# Patient Record
Sex: Female | Born: 2000 | Race: White | Hispanic: No | Marital: Single | Smoking: Current every day smoker
Health system: Southern US, Community
[De-identification: ages and names within clinical notes are randomized; demographics above are authoritative.]

## PROBLEM LIST (undated history)

## (undated) DIAGNOSIS — F419 Anxiety disorder, unspecified: Secondary | ICD-10-CM

## (undated) DIAGNOSIS — F329 Major depressive disorder, single episode, unspecified: Secondary | ICD-10-CM

## (undated) DIAGNOSIS — R45851 Suicidal ideations: Secondary | ICD-10-CM

## (undated) DIAGNOSIS — M359 Systemic involvement of connective tissue, unspecified: Secondary | ICD-10-CM

## (undated) DIAGNOSIS — F449 Dissociative and conversion disorder, unspecified: Secondary | ICD-10-CM

## (undated) DIAGNOSIS — K589 Irritable bowel syndrome without diarrhea: Secondary | ICD-10-CM

## (undated) DIAGNOSIS — K200 Eosinophilic esophagitis: Secondary | ICD-10-CM

## (undated) DIAGNOSIS — F32A Depression, unspecified: Secondary | ICD-10-CM

## (undated) DIAGNOSIS — F5105 Insomnia due to other mental disorder: Secondary | ICD-10-CM

## (undated) DIAGNOSIS — F938 Other childhood emotional disorders: Secondary | ICD-10-CM

## (undated) HISTORY — PX: COLONOSCOPY: SHX174

## (undated) HISTORY — PX: UPPER GI ENDOSCOPY: SHX6162

---
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
## 2022-03-04 ENCOUNTER — Inpatient Hospital Stay: Admission: RE | Admit: 2022-03-04 | Payer: Self-pay | Source: Ambulatory Visit

## 2022-04-13 IMAGING — DX DG ANKLE COMPLETE 3+V*R*
3 series · 3 of 3 positions shown · non-contrast
Comparison: None.

CLINICAL DATA: Foot and ankle pain

EXAM:
RIGHT ANKLE - COMPLETE 3+ VIEW

[ankle obl]
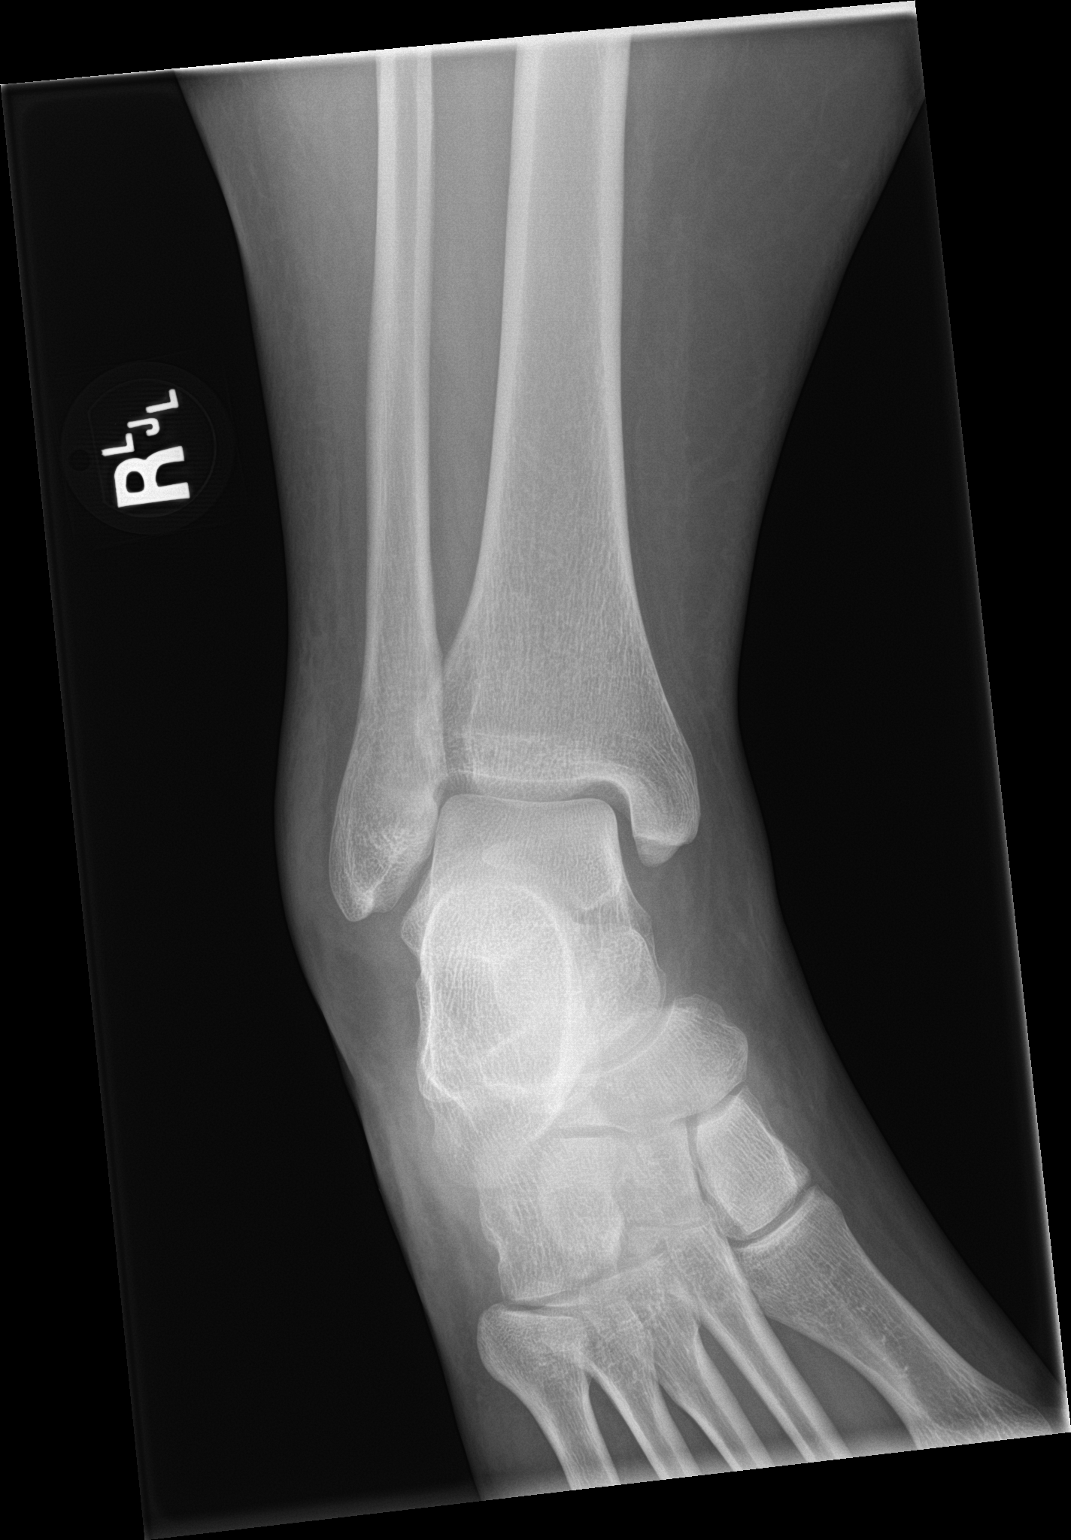

[ankle ap]
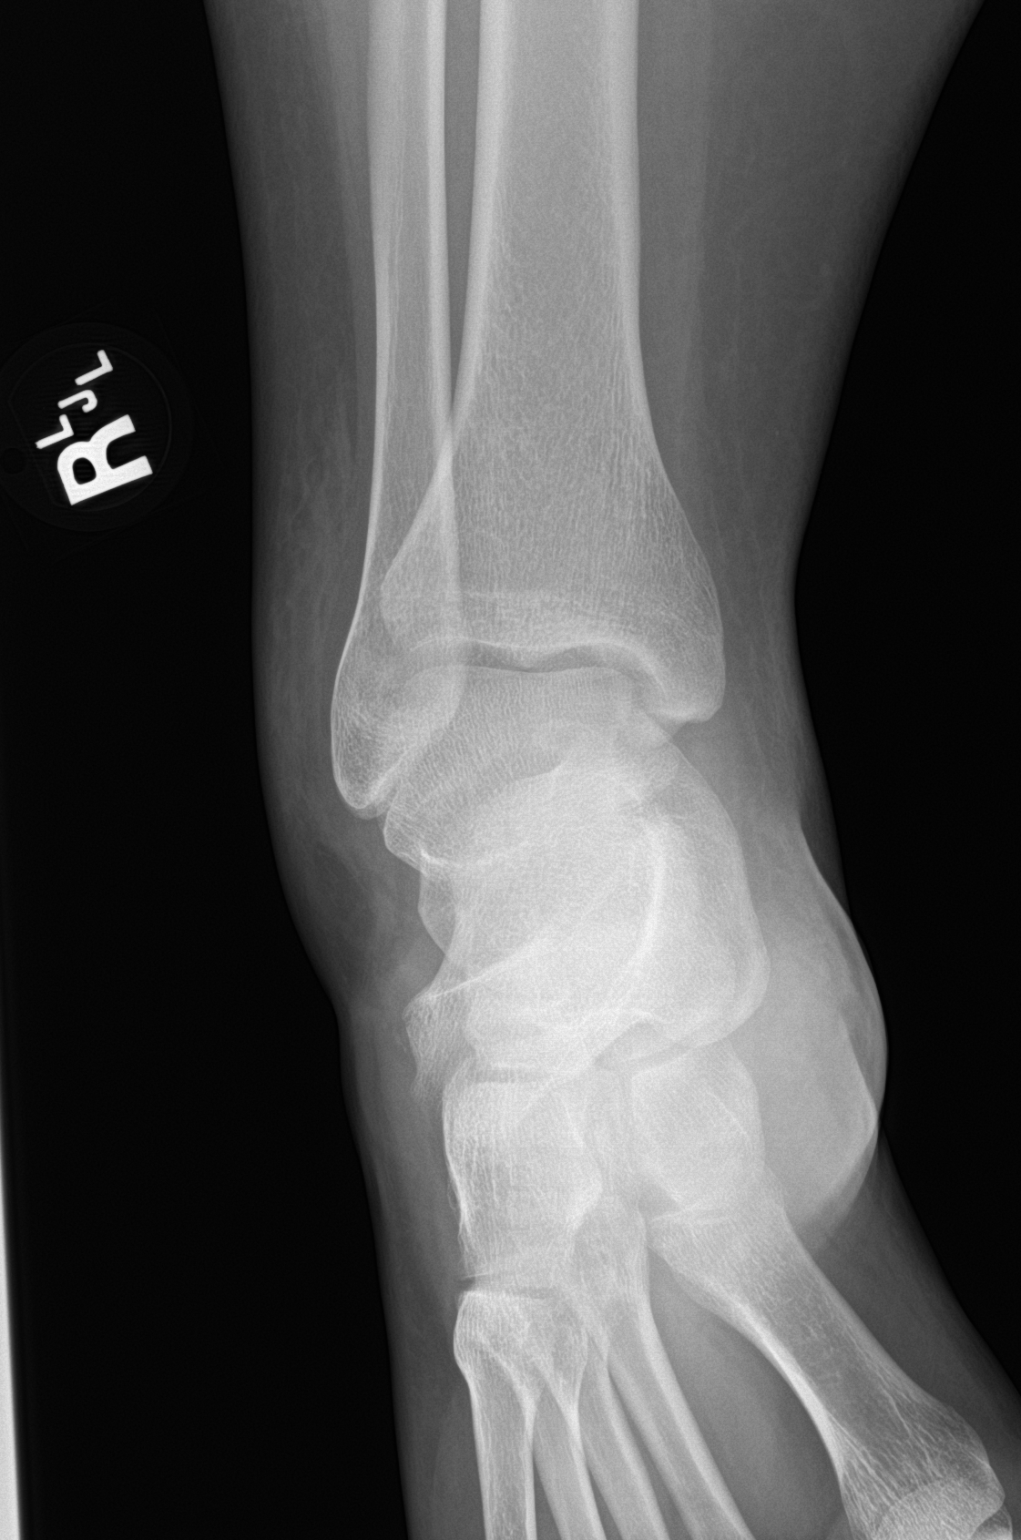

[ankle lat]
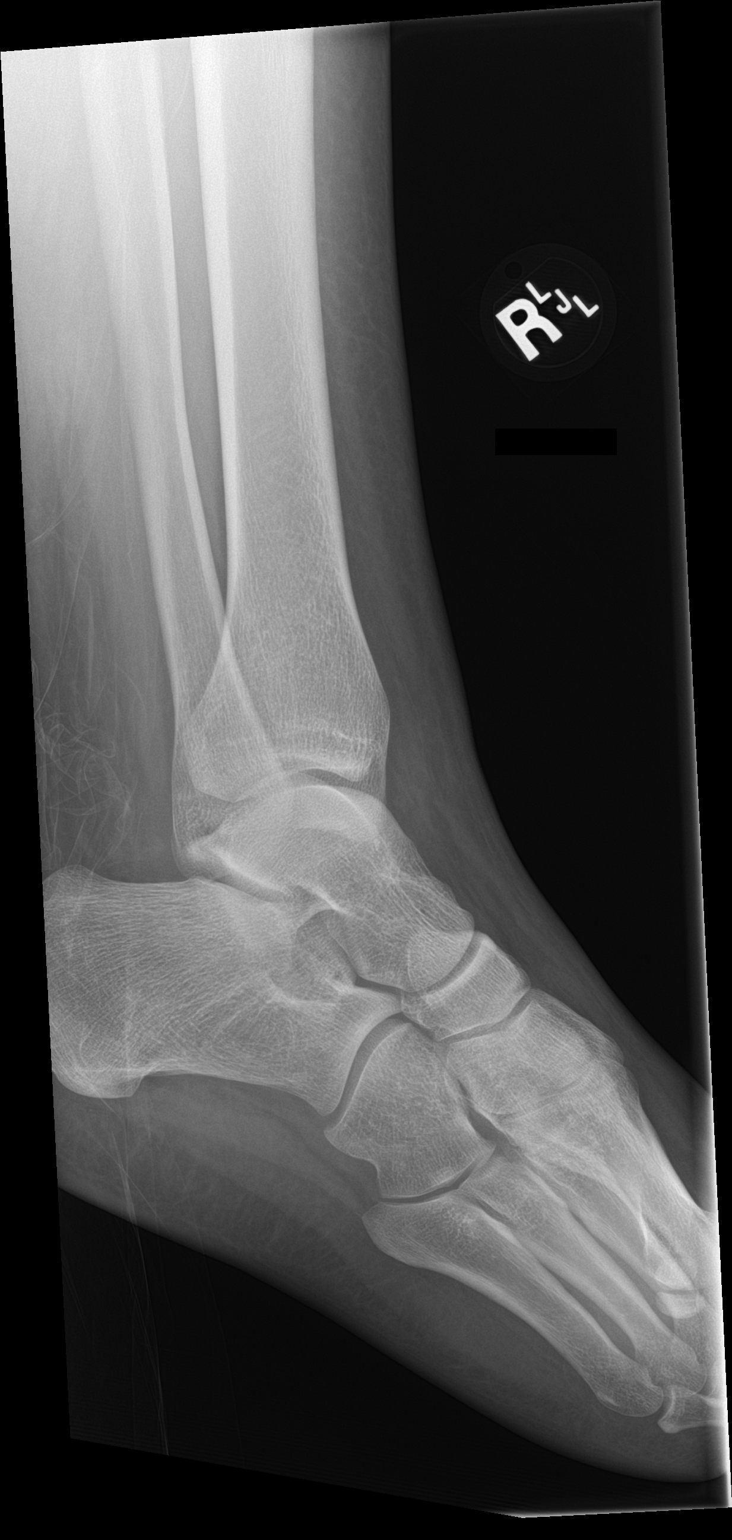

[3 of 3 positions shown; findings below may reference images not displayed]

FINDINGS: Diffuse soft tissue swelling. No acute bony abnormality.
Specifically, no fracture, subluxation, or dislocation.
IMPRESSION: No acute bony abnormality.

## 2022-04-13 IMAGING — DX DG FOOT COMPLETE 3+V*R*
3 series · 3 of 3 positions shown · non-contrast
Comparison: None.

CLINICAL DATA: Right foot pain.

EXAM:
RIGHT FOOT COMPLETE - 3+ VIEW

[foot ap]
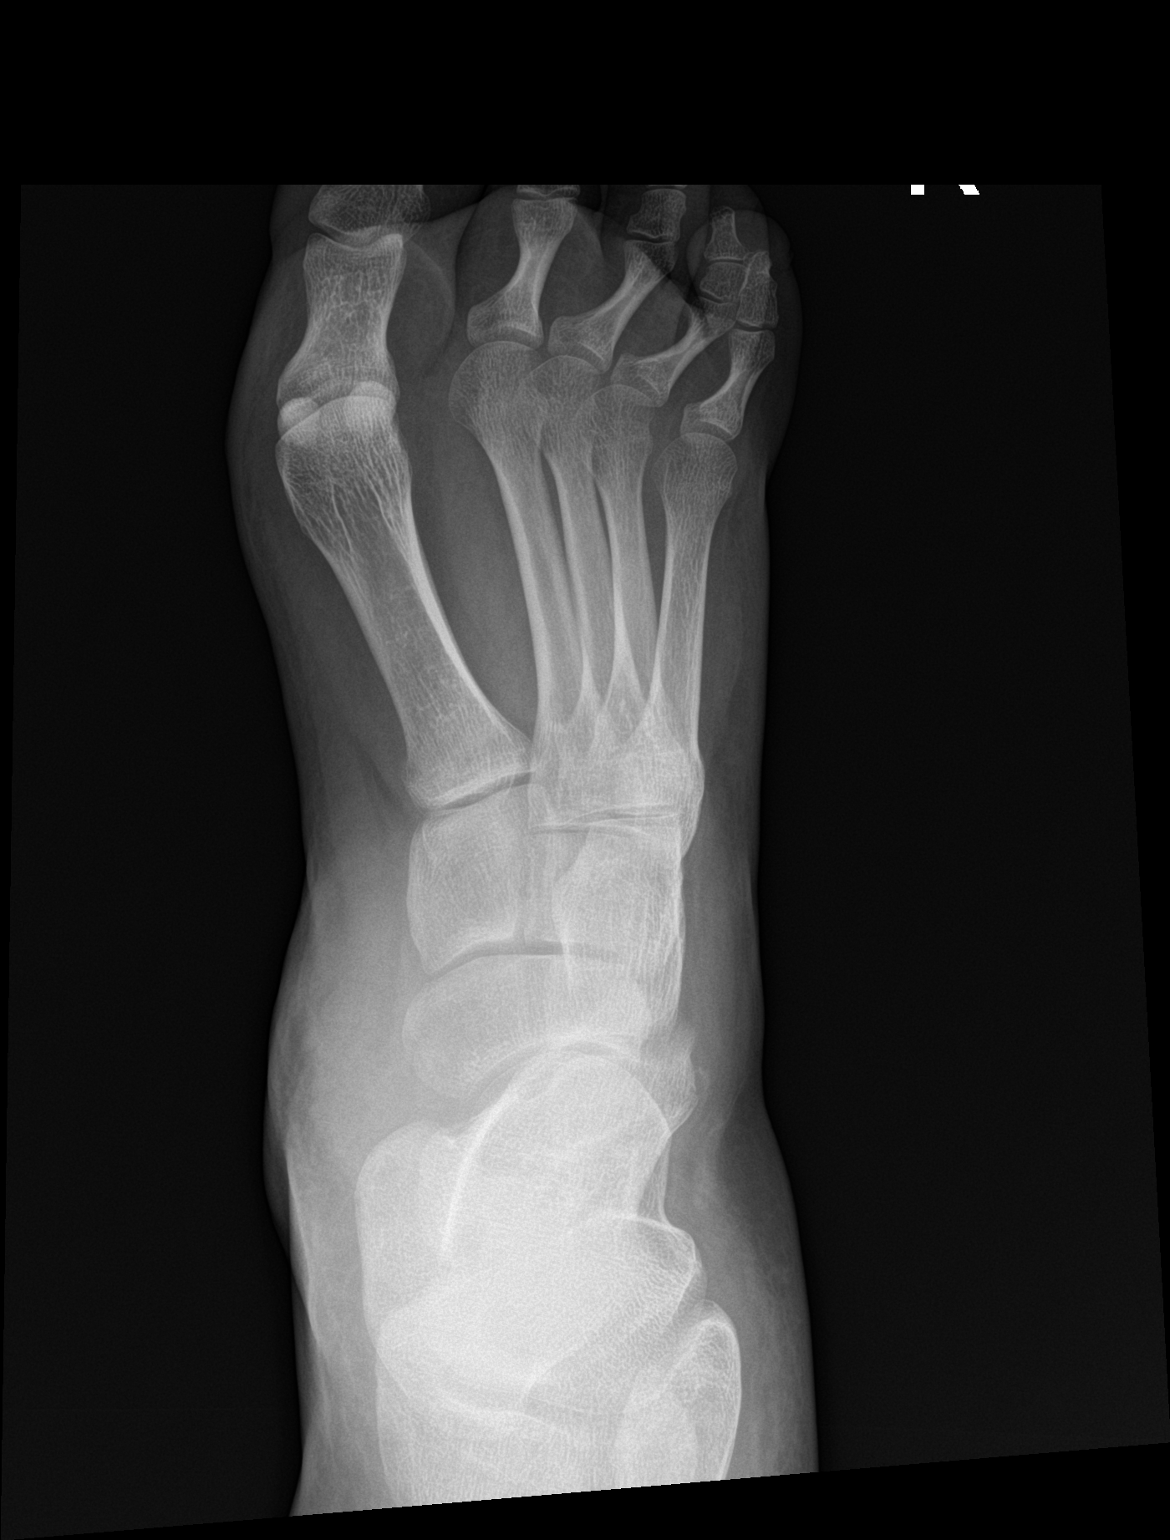

[foot obl]
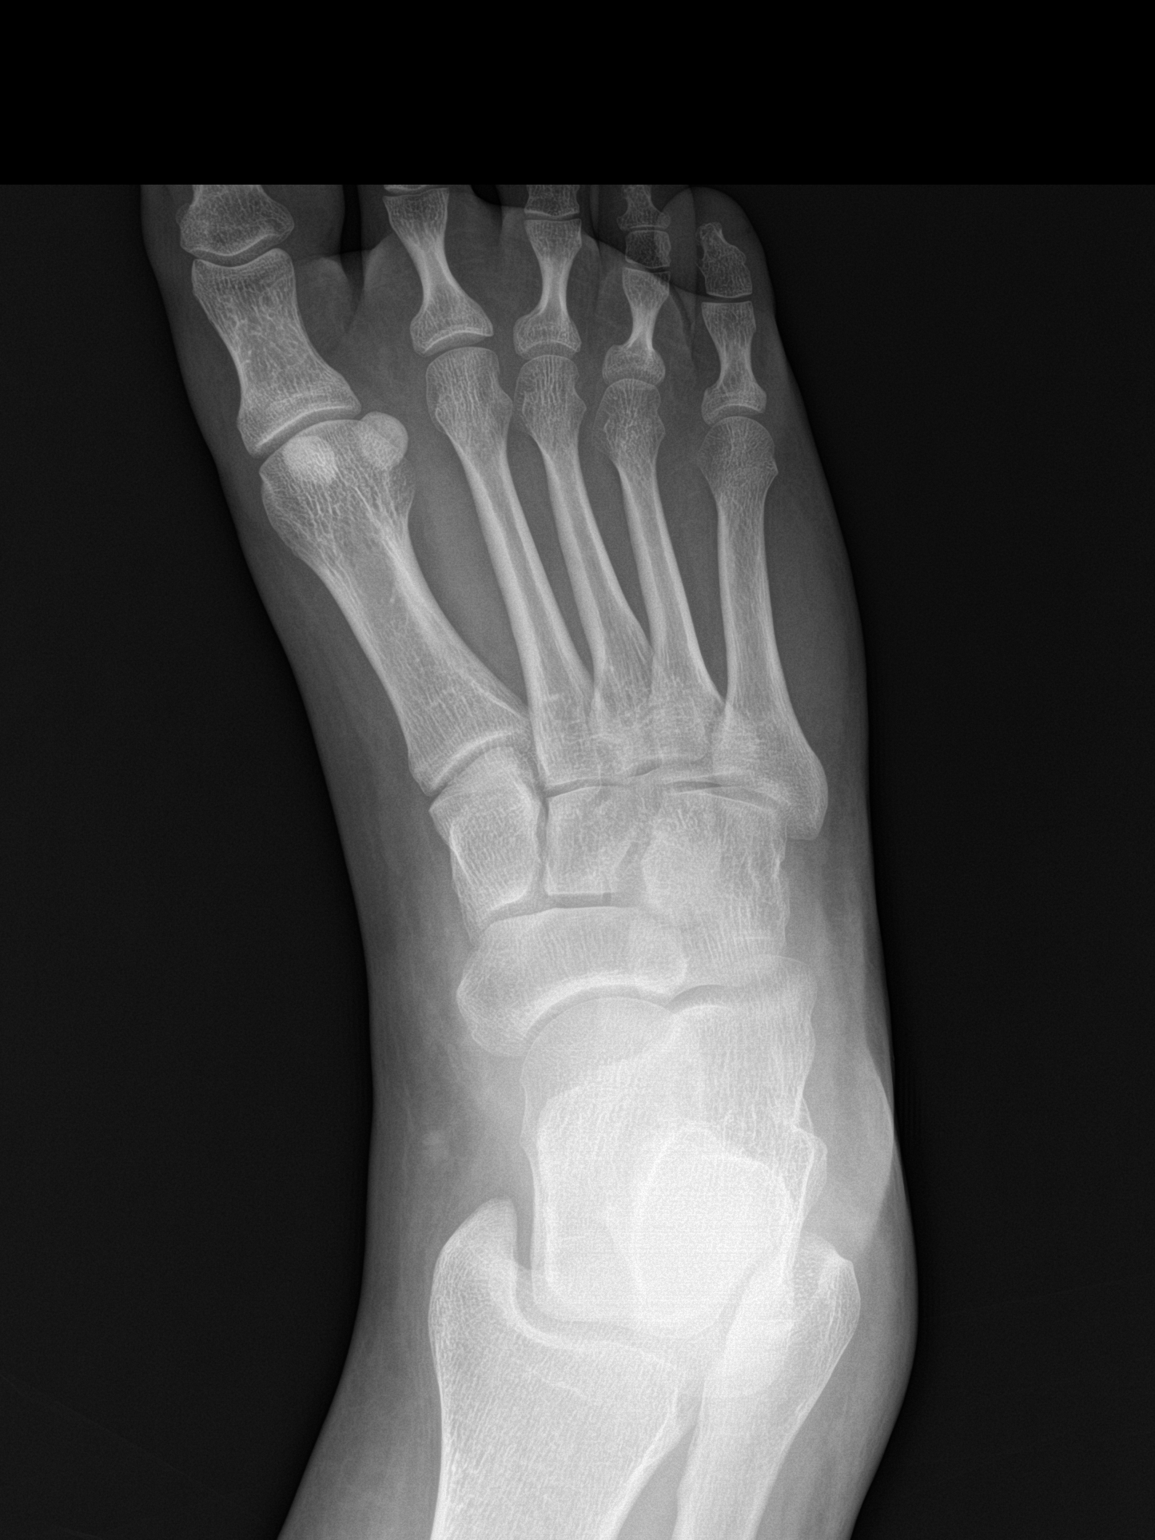

[foot lat]
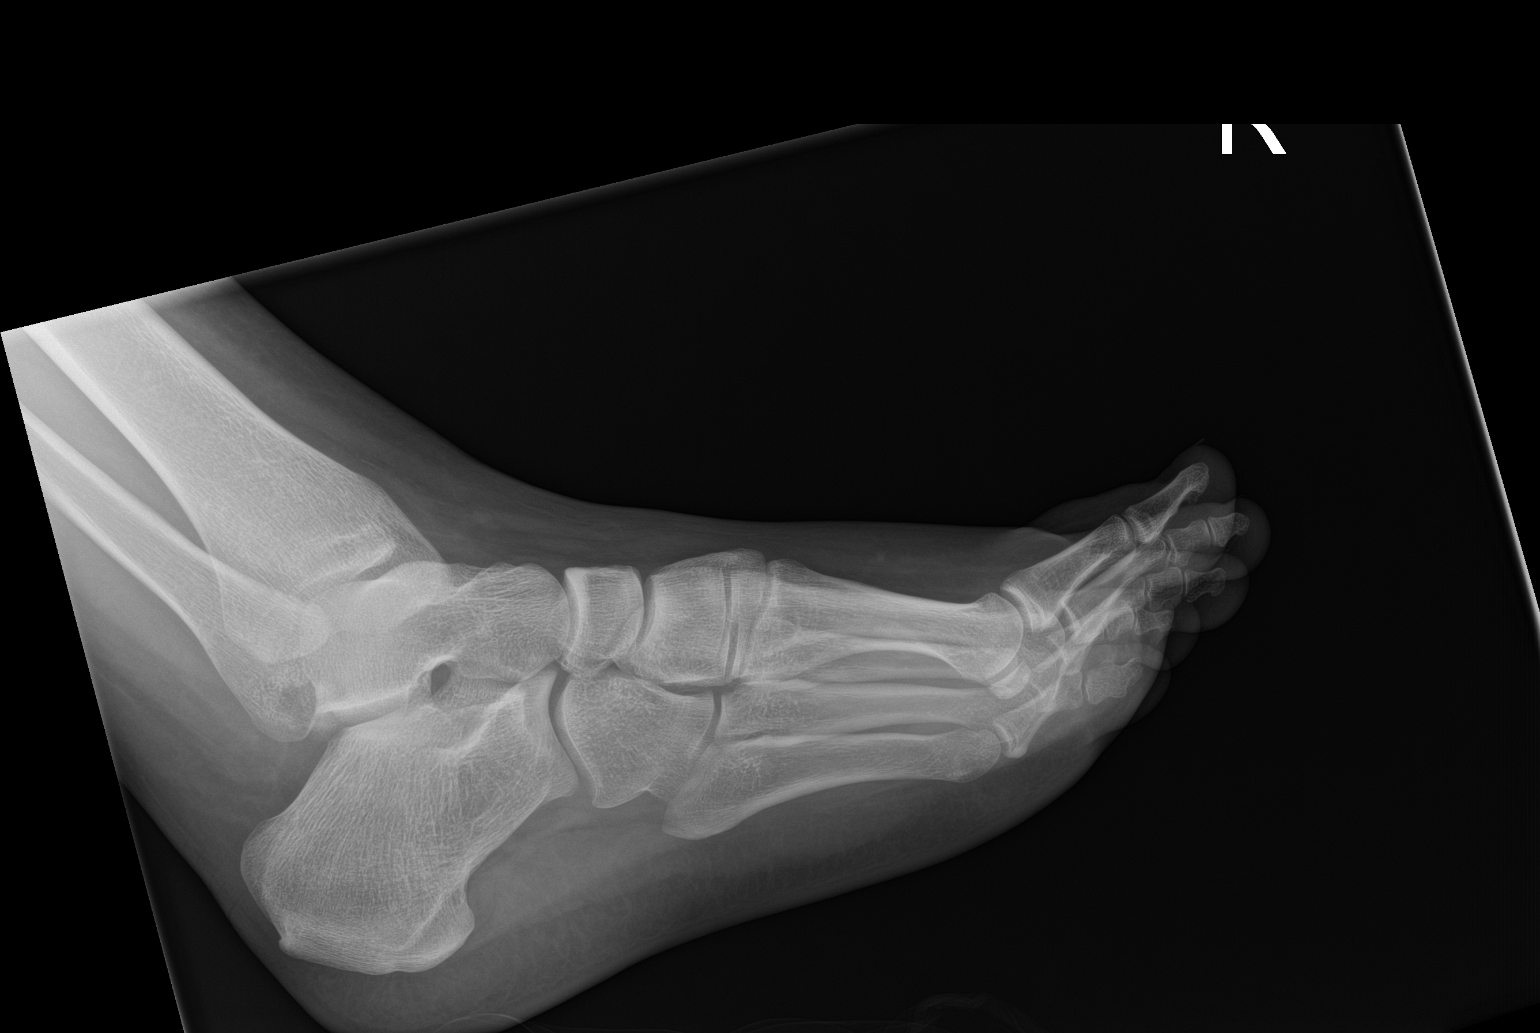

[3 of 3 positions shown; findings below may reference images not displayed]

FINDINGS: There is no evidence of fracture or dislocation. There is no
evidence of arthropathy or other focal bone abnormality. Soft
tissues are unremarkable.
IMPRESSION: Negative.

## 2022-04-16 ENCOUNTER — Encounter: Payer: Self-pay | Admitting: Internal Medicine

## 2022-07-12 ENCOUNTER — Ambulatory Visit
Admission: RE | Admit: 2022-07-12 | Discharge: 2022-07-12 | Disposition: A | Discharge status: Home / Self Care | Payer: Medicaid Other | Source: Ambulatory Visit | Attending: Family Medicine | Admitting: Family Medicine

## 2022-07-12 VITALS — BP 103/53 | HR 100 | Temp 97.7°F | Resp 18

## 2022-07-12 DIAGNOSIS — J069 Acute upper respiratory infection, unspecified: Secondary | ICD-10-CM

## 2022-07-12 NOTE — ED Triage Notes (Signed)
Patient presents to UC for cough and nasal congestion x 3 days. Treating with sudafed.

## 2022-07-12 NOTE — ED Provider Notes (Signed)
Renaldo Fiddler    CSN: [id number] Arrival date & time: 07/12/22  1559      History   Chief Complaint Chief Complaint  Patient presents with   Nasal Congestion    I've had nasal and chest congestion, cough, and just feel crappy. - Entered by patient    HPI Kathryn [name] is a 22 y.o. female.   Patient presents for evaluation of nasal congestion, rhinorrhea and a nonproductive cough present for 3 days.  Endorses general malaise, worsening at nighttime.  Has begun to spill some soreness to the tonsils, endorses tonsillar adenopathy at baseline and has been told that she needs removal.  Has attempted use of pseudoephedrine, NyQuil which has been helpful.  Denies fevers, shortness of breath or wheeze.  Denies respiratory history.  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   Vasovagal syncope 03/20/2017   Migraine without aura and without status migrainosus, not intractable 03/20/2017   Episodic tension-type headache, not intractable 03/20/2017   Transient alteration of awareness 03/20/2017   Seizure-like activity (HCC) 03/18/2017   Difficulty walking 03/18/2017   MDD (major depressive disorder) 01/10/2017   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      Home Medications    Prior to Admission medications   Medication Sig Start Date End Date Taking? Authorizing Provider  acetaminophen (TYLENOL) 500 MG tablet Take 1,000 mg by mouth every 6 (six) hours as needed for headache.    [provider]  clindamycin (CLEOCIN T) 1 % external solution Apply topically 2 (two) times daily. 02/02/21   [provider]  FLUoxetine (PROZAC) 20 MG capsule Take 20 mg by mouth daily.    [provider]  lisdexamfetamine (VYVANSE) 60 MG capsule Take 60 mg by mouth every morning.    [provider]  loratadine (CLARITIN) 10 MG tablet Take 10 mg by mouth daily. 03/04/16   [provider]  metoprolol tartrate (LOPRESSOR) 25 MG tablet Take 0.5 tablets (12.5 mg total) by mouth 2 (two) times daily. 07/25/21   Maisie Fus, MD  Multiple Vitamin (MULTIVITAMIN WITH MINERALS) TABS tablet Take 1 tablet by mouth daily.    [provider]  QUEtiapine (SEROQUEL) 50 MG tablet TAKE ONE TABLET AT BEDTIME. YOU MAY TAKE ANOTHER TABLET IN ONE HOUR IF NEEDED. 12/27/17   [provider]    Family History History reviewed. No pertinent family history.  Social History Social History   Tobacco Use   Smoking status: Every Day    Types: E-cigarettes   Smokeless tobacco: Never   Tobacco comments:    vapes  Vaping Use   Vaping Use: Never used  Substance Use Topics   Alcohol use:  No   Drug use: No     Allergies   Flovent hfa [fluticasone], Ibuprofen, Lactose intolerance (gi), Milk-related compounds, Pollen extract, and Sulfamethoxazole-trimethoprim   Review of Systems Review of Systems   Physical Exam Triage Vital Signs ED Triage Vitals  Enc Vitals Group     BP 07/12/22 1704 (!) 103/53     Pulse Rate 07/12/22 1704 100     Resp 07/12/22 1704 18     Temp 07/12/22 1704 97.7 F (36.5 C)     Temp Source 07/12/22 1704 Temporal     SpO2 07/12/22 1704 97 %     Weight --       Height --      Head Circumference --      Peak Flow --      Pain Score 07/12/22 1703 0     Pain Loc --      Pain Edu? --      Excl. in GC? --    No data found.  Updated Vital Signs BP (!) 103/53 (BP Location: Left Arm)   Pulse 100   Temp 97.7 F (36.5 C) (Temporal)   Resp 18   SpO2 97%   Visual Acuity Right Eye Distance:   Left Eye Distance:   Bilateral Distance:    Right Eye Near:   Left Eye Near:    Bilateral Near:     Physical Exam Constitutional:      Appearance: Normal appearance.  HENT:     Right Ear: Tympanic membrane, ear canal and external ear normal.     Left Ear: Tympanic membrane, ear canal and external ear normal.     Nose: Congestion and rhinorrhea present.     Mouth/Throat:     Pharynx: No posterior oropharyngeal erythema.     Tonsils: Tonsillar exudate present. 3+ on the right. 3+ on the left.  Cardiovascular:     Rate and Rhythm: Normal rate and regular rhythm.     Pulses: Normal pulses.     Heart sounds: Normal heart sounds.  Pulmonary:     Effort: Pulmonary effort is normal.     Breath sounds: Normal breath sounds.  Neurological:     Mental Status: She is alert and oriented to person, place, and time. Mental status is at baseline.      UC Treatments / Results  Labs (all labs ordered are listed, but only abnormal results are displayed) Labs Reviewed - No data to display  EKG   Radiology No results found.  Procedures Procedures (including critical care time)  Medications Ordered in UC Medications - No data to display  Initial Impression / Assessment and Plan / UC Course  I have reviewed the triage vital signs and the nursing notes.  Pertinent labs & imaging results that were available during my care of the patient were reviewed by me and considered in my medical decision making (see chart for details).  Viral URI with cough  Patient is in no signs of distress nor toxic appearing.  Vital signs are stable.  Low suspicion for  pneumonia, pneumothorax or bronchitis and therefore will defer imaging. declined Strep test.May use additional over-the-counter medications as needed for supportive care.  May follow-up with urgent care as needed if symptoms persist or worsen.  Note given.   Final Clinical Impressions(s) / UC Diagnoses   Final diagnoses:  Viral URI with cough     Discharge Instructions      Your symptoms today are most likely being caused by  a virus and should steadily improve in time it can take up to 7 to 10 days before you truly start to see a turnaround however things will get better    You can take Tylenol and/or Ibuprofen as needed for fever reduction and pain relief.   For cough: honey 1/2 to 1 teaspoon (you can dilute the honey in water or another fluid).  You can also use guaifenesin and dextromethorphan for cough. You can use a humidifier for chest congestion and cough.  If you don't have a humidifier, you can sit in the bathroom with the hot shower running.      For sore throat: try warm salt water gargles, cepacol lozenges, throat spray, warm tea or water with lemon/honey, popsicles or ice, or OTC cold relief medicine for throat discomfort.   For congestion: take a daily anti-histamine like Zyrtec, Claritin, and a oral decongestant, such as pseudoephedrine.  You can also use Flonase 1-2 sprays in each nostril daily.   It is important to stay hydrated: drink plenty of fluids (water, gatorade/powerade/pedialyte, juices, or teas) to keep your throat moisturized and help further relieve irritation/discomfort.    ED Prescriptions   None    PDMP not reviewed this encounter.   Valinda Hoar, NP 07/12/22 [phone]

## 2022-07-12 NOTE — Discharge Instructions (Signed)

## 2022-08-21 ENCOUNTER — Encounter: Payer: Medicaid Other | Admitting: Family Medicine

## 2022-09-01 ENCOUNTER — Inpatient Hospital Stay: Admission: RE | Admit: 2022-09-01 | Payer: MEDICAID | Source: Ambulatory Visit

## 2022-09-06 ENCOUNTER — Ambulatory Visit
Admission: EM | Admit: 2022-09-06 | Discharge: 2022-09-06 | Disposition: A | Discharge status: Home / Self Care | Payer: MEDICAID | Attending: Family Medicine | Admitting: Family Medicine

## 2022-09-06 ENCOUNTER — Ambulatory Visit (INDEPENDENT_AMBULATORY_CARE_PROVIDER_SITE_OTHER): Payer: MEDICAID

## 2022-09-06 DIAGNOSIS — S4992XA Unspecified injury of left shoulder and upper arm, initial encounter: Secondary | ICD-10-CM | POA: Diagnosis not present

## 2022-09-06 MED ORDER — CYCLOBENZAPRINE HCL 10 MG PO TABS: 10.0000 mg | ORAL_TABLET | Freq: Two times a day (BID) | ORAL | 0 refills | Status: AC | PRN

## 2022-09-06 NOTE — ED Provider Notes (Signed)
Renaldo Fiddler    CSN: [id number] Arrival date & time: 09/06/22  1259      History   Chief Complaint Chief Complaint  Patient presents with   Shoulder Injury    HPI Kathryn [name] is a 22 y.o. female.   HPI Patient here for evaluation of left shoulder injury. She reports 6 days ago her left shoulder "popped out of place"twice and she had to put shoulder back into place. Complains of ongoing shoulder pain. She is also requesting work excuse dating back to the day her injury occurred. She is reporting that she is unable to lift boxes and is suppose to return to work tomorrow.  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   Vasovagal syncope 03/20/2017   Migraine without aura and without status migrainosus, not intractable 03/20/2017   Episodic tension-type headache, not intractable 03/20/2017   Transient alteration of awareness 03/20/2017   Seizure-like activity (HCC) 03/18/2017   Difficulty walking 03/18/2017   MDD (major depressive disorder) 01/10/2017   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      Home Medications    Prior to Admission medications   Medication Sig Start Date End Date Taking? Authorizing Provider   cyclobenzaprine (FLEXERIL) 10 MG tablet Take 1 tablet (10 mg total) by mouth 2 (two) times daily as needed for muscle spasms. 09/06/22  Yes Bing Neighbors, NP  acetaminophen (TYLENOL) 500 MG tablet Take 1,000 mg by mouth every 6 (six) hours as needed for headache.    [provider]  clindamycin (CLEOCIN T) 1 % external solution Apply topically 2 (two) times daily. 02/02/21   [provider]  FLUoxetine (PROZAC) 20 MG capsule Take 20 mg by mouth daily.    [provider]  lisdexamfetamine (VYVANSE) 60 MG capsule Take 60 mg by mouth every morning.    [provider]  loratadine (CLARITIN) 10 MG tablet Take 10 mg by mouth daily. 03/04/16   [provider]  metoprolol tartrate (LOPRESSOR) 25 MG tablet Take 0.5 tablets (12.5 mg total) by mouth 2 (two) times daily. 07/25/21   Maisie Fus, MD  Multiple Vitamin (MULTIVITAMIN WITH MINERALS) TABS tablet Take 1 tablet by mouth daily.    [provider]  QUEtiapine (SEROQUEL) 50 MG tablet TAKE ONE TABLET AT BEDTIME. YOU MAY TAKE ANOTHER TABLET IN ONE HOUR IF NEEDED. 12/27/17   [provider]    Family History History reviewed. No pertinent family history.  Social History Social History   Tobacco Use   Smoking status: Every Day    Types: E-cigarettes   Smokeless tobacco: Never   Tobacco comments:    vapes  Vaping Use   Vaping status:  Never Used  Substance Use Topics   Alcohol use: No   Drug use: No     Allergies   Flovent hfa [fluticasone], Ibuprofen, Lactose intolerance (gi), Milk-related compounds, Pollen extract, and Sulfamethoxazole-trimethoprim   Review of Systems Review of Systems Pertinent negatives listed in HPI   Physical Exam Triage Vital Signs ED Triage Vitals [09/06/22 1331]  Encounter Vitals Group     BP 112/82     Systolic BP Percentile      Diastolic BP Percentile      Pulse Rate (!) 102     Resp 18     Temp 98.2 F (36.8 C)     Temp Source Oral      SpO2 97 %     Weight      Height      Head Circumference      Peak Flow      Pain Score      Pain Loc      Pain Education      Exclude from Growth Chart    No data found.  Updated Vital Signs BP 112/82 (BP Location: Left Arm)   Pulse (!) 102   Temp 98.2 F (36.8 C) (Oral)   Resp 18   SpO2 97%   Visual Acuity Right Eye Distance:   Left Eye Distance:   Bilateral Distance:    Right Eye Near:   Left Eye Near:    Bilateral Near:     Physical Exam Vitals reviewed.  Constitutional:      Appearance: Normal appearance.  Eyes:     Extraocular Movements: Extraocular movements intact.     Conjunctiva/sclera: Conjunctivae normal.     Pupils: Pupils are equal, round, and reactive to light.  Cardiovascular:     Rate and Rhythm: Normal rate and regular rhythm.  Pulmonary:     Effort: Pulmonary effort is normal.     Breath sounds: Normal breath sounds.  Musculoskeletal:     Right shoulder: Normal.     Left shoulder: Normal.     Cervical back: Normal range of motion.  Skin:    General: Skin is warm and dry.     Capillary Refill: Capillary refill takes less than 2 seconds.  Neurological:     General: No focal deficit present.     Mental Status: She is alert.      UC Treatments / Results  Labs (all labs ordered are listed, but only abnormal results are displayed) Labs Reviewed - No data to display  EKG   Radiology DG Shoulder Left  Result Date: 09/06/2022 CLINICAL DATA:  Left shoulder pain after picking up heavy object 6 days ago. EXAM: LEFT SHOULDER - 2+ VIEW COMPARISON:  None Available. FINDINGS: There is no evidence of fracture or dislocation. There is no evidence of arthropathy or other focal bone abnormality. Soft tissues are unremarkable. IMPRESSION: Negative. Electronically Signed   By: Lupita Raider M.D.   On: 09/06/2022 14:09    Procedures Procedures (including critical care time)  Medications Ordered in UC Medications - No data to  display  Initial Impression / Assessment and Plan / UC Course  I have reviewed the triage vital signs and the nursing notes.  Pertinent labs & imaging results that were available during my care of the patient were reviewed by me and considered in my medical decision making (see chart for details).    Shoulder injury, imaging unremarkable. Exam is negative for trauma or limited ROM. Work noted provided excusing  her from work today with a return date 09/09/22. Pt requesting work restrictions, deferred to Occupational Medicine to follow-up. Patient verbalized understanding and agreement with plan. Final Clinical Impressions(s) / UC Diagnoses   Final diagnoses:  Shoulder injury, left, initial encounter     Discharge Instructions      Follow-up at Occupational Medicine on Monday to be evaluated for work restrictions. Muscle relaxer given you have an allergy to ibuprofen listed.  The muscle relaxer can cause drowsiness therefore only take when not driving a vehicle or operate machinery.   You should continue to apply warm compresses to your shoulder and can continue the Biofreeze.  Take extra Tylenol this is helpful in managing pain..     ED Prescriptions     Medication Sig Dispense Auth. Provider   cyclobenzaprine (FLEXERIL) 10 MG tablet Take 1 tablet (10 mg total) by mouth 2 (two) times daily as needed for muscle spasms. 10 tablet Bing Neighbors, NP      PDMP not reviewed this encounter.   Bing Neighbors, NP 09/06/22 1427

## 2022-09-06 NOTE — ED Triage Notes (Signed)
Patient presents to [facility] for left shoulder pain x Saturday. States she has had to pop it back in place 2 times. States she works for [employer] Continues to have pain.

## 2022-09-06 NOTE — Discharge Instructions (Addendum)
Follow-up at Occupational Medicine on Monday to be evaluated for work restrictions. Muscle relaxer given you have an allergy to ibuprofen listed.  The muscle relaxer can cause drowsiness therefore only take when not driving a vehicle or operate machinery.   You should continue to apply warm compresses to your shoulder and can continue the Biofreeze.  Take extra Tylenol this is helpful in managing pain.Marland Kitchen

## 2023-03-12 ENCOUNTER — Other Ambulatory Visit: Payer: Self-pay

## 2023-03-12 ENCOUNTER — Emergency Department
Admission: EM | Admit: 2023-03-12 | Discharge: 2023-03-13 | Disposition: A | Discharge status: Home / Self Care | Payer: MEDICAID | Attending: Emergency Medicine | Admitting: Emergency Medicine

## 2023-03-12 ENCOUNTER — Emergency Department: Payer: MEDICAID

## 2023-03-12 DIAGNOSIS — Z79899 Other long term (current) drug therapy: Secondary | ICD-10-CM | POA: Diagnosis not present

## 2023-03-12 DIAGNOSIS — R002 Palpitations: Secondary | ICD-10-CM | POA: Insufficient documentation

## 2023-03-12 DIAGNOSIS — R0602 Shortness of breath: Secondary | ICD-10-CM | POA: Insufficient documentation

## 2023-03-12 DIAGNOSIS — R079 Chest pain, unspecified: Secondary | ICD-10-CM

## 2023-03-12 DIAGNOSIS — M940 Chondrocostal junction syndrome [Tietze]: Secondary | ICD-10-CM | POA: Insufficient documentation

## 2023-03-12 DIAGNOSIS — Z20822 Contact with and (suspected) exposure to covid-19: Secondary | ICD-10-CM | POA: Diagnosis not present

## 2023-03-12 LAB — BASIC METABOLIC PANEL
Anion gap: 12 (ref 5–15)
BUN: 12 mg/dL (ref 6–20)
CO2: 25 mmol/L (ref 22–32)
Calcium: 9.4 mg/dL (ref 8.9–10.3)
Chloride: 103 mmol/L (ref 98–111)
Creatinine, Ser: 0.93 mg/dL (ref 0.44–1.00)
GFR, Estimated: >60 mL/min (ref >60)
Glucose, Bld: 103 mg/dL — ABNORMAL HIGH (ref 70–99)
Potassium: 3.6 mmol/L (ref 3.5–5.1)
Sodium: 140 mmol/L (ref 135–145)

## 2023-03-12 LAB — TROPONIN I (HIGH SENSITIVITY): Troponin I (High Sensitivity): <2 ng/L (ref <18)

## 2023-03-12 LAB — CBC
HCT: 41.1 % (ref 36.0–46.0)
Hemoglobin: 13.7 g/dL (ref 12.0–15.0)
MCH: 27.7 pg (ref 26.0–34.0)
MCHC: 33.3 g/dL (ref 30.0–36.0)
MCV: 83.2 fL (ref 80.0–100.0)
Platelets: 358 10*3/uL (ref 150–400)
RBC: 4.94 MIL/uL (ref 3.87–5.11)
RDW: 14.5 % (ref 11.5–15.5)
WBC: 10.5 10*3/uL (ref 4.0–10.5)
nRBC: 0 % (ref 0.0–0.2)

## 2023-03-12 NOTE — ED Provider Triage Note (Signed)
 Emergency Medicine Provider Triage Evaluation Note  Kathryn [name] , a 23 y.o. female  was evaluated in triage.  Pt complains of right upper quadrant pain, patient states radiates today left jaw, patient states is not related with meals.  Patient denies nauseous vomit diarrhea fever.  Patient denies GERD  Review of Systems  Positive:  Negative:   Physical Exam  BP (!) 143/90   Pulse (!) 106   Temp 98.1 F (36.7 C) (Oral)   Resp 18   Ht 5' 9 (1.753 m)   Wt 129.3 kg   SpO2 98%   BMI 42.09 kg/m  Gen:   Awake, no distress   Resp:  Normal effort  MSK:   Moves extremities without difficulty  Other:  Abdomen Murphy sign is negative  Medical Decision Making  Medically screening exam initiated at 9:27 PM.  Appropriate orders placed.  Kathryn [name] was informed that the remainder of the evaluation will be completed by another provider, this initial triage assessment does not replace that evaluation, and the importance of remaining in the ED until their evaluation is complete.     Janit Kast, PA-C 03/12/23 2128

## 2023-03-12 NOTE — ED Triage Notes (Signed)
 Pt reports chest pain sob and jaw pain that began yesterday. Pt denies cough congestion or fever. Pt denies cardiac hx.

## 2023-03-13 LAB — HEPATIC FUNCTION PANEL
ALT: 10 U/L (ref 0–44)
AST: 20 U/L (ref 15–41)
Albumin: 4.5 g/dL (ref 3.5–5.0)
Alkaline Phosphatase: 37 U/L — ABNORMAL LOW (ref 38–126)
Bilirubin, Direct: 0.2 mg/dL (ref 0.0–0.2)
Indirect Bilirubin: 0.4 mg/dL (ref 0.3–0.9)
Total Bilirubin: 0.6 mg/dL (ref 0.0–1.2)
Total Protein: 8.3 g/dL — ABNORMAL HIGH (ref 6.5–8.1)

## 2023-03-13 LAB — TSH: TSH: 8.207 u[IU]/mL — ABNORMAL HIGH (ref 0.350–4.500)

## 2023-03-13 LAB — MONONUCLEOSIS SCREEN: Mono Screen: NEGATIVE

## 2023-03-13 LAB — RESP PANEL BY RT-PCR (RSV, FLU A&B, COVID)  RVPGX2
Influenza A by PCR: NEGATIVE
Influenza B by PCR: NEGATIVE
Resp Syncytial Virus by PCR: NEGATIVE
SARS Coronavirus 2 by RT PCR: NEGATIVE

## 2023-03-13 LAB — POC URINE PREG, ED: Preg Test, Ur: NEGATIVE

## 2023-03-13 LAB — TROPONIN I (HIGH SENSITIVITY): Troponin I (High Sensitivity): <2 ng/L

## 2023-03-13 LAB — D-DIMER, QUANTITATIVE: D-Dimer, Quant: <0.27 ug{FEU}/mL (ref 0.00–0.50)

## 2023-03-13 LAB — VALPROIC ACID LEVEL: Valproic Acid Lvl: 90 ug/mL (ref 50.0–100.0)

## 2023-03-13 LAB — LIPASE, BLOOD: Lipase: 32 U/L (ref 11–51)

## 2023-03-13 MED ORDER — KETOROLAC TROMETHAMINE 30 MG/ML IJ SOLN
15.0000 mg | Freq: Once | INTRAMUSCULAR | Status: AC
  Administered 2023-03-13: 15 mg via INTRAVENOUS
  Filled 2023-03-13: qty 1

## 2023-03-13 MED ORDER — SODIUM CHLORIDE 0.9 % IV BOLUS
1000.0000 mL | Freq: Once | INTRAVENOUS | Status: AC
  Administered 2023-03-13: 1000 mL via INTRAVENOUS

## 2023-03-13 MED ORDER — HYDROCODONE-ACETAMINOPHEN 5-325 MG PO TABS: 1.0000 | ORAL_TABLET | Freq: Four times a day (QID) | ORAL | 0 refills | Status: AC | PRN

## 2023-03-13 MED ORDER — MORPHINE SULFATE (PF) 4 MG/ML IV SOLN
4.0000 mg | Freq: Once | INTRAVENOUS | Status: AC
  Administered 2023-03-13: 4 mg via INTRAVENOUS
  Filled 2023-03-13: qty 1

## 2023-03-13 MED ORDER — ONDANSETRON HCL 4 MG/2ML IJ SOLN
4.0000 mg | Freq: Once | INTRAMUSCULAR | Status: AC
  Administered 2023-03-13: 4 mg via INTRAVENOUS
  Filled 2023-03-13: qty 2

## 2023-03-13 MED ORDER — NAPROXEN 500 MG PO TABS: 500.0000 mg | ORAL_TABLET | Freq: Two times a day (BID) | ORAL | 0 refills | Status: AC

## 2023-03-13 MED ORDER — FAMOTIDINE IN NACL 20-0.9 MG/50ML-% IV SOLN
20.0000 mg | Freq: Once | INTRAVENOUS | Status: AC
  Administered 2023-03-13: 20 mg via INTRAVENOUS
  Filled 2023-03-13: qty 50

## 2023-03-13 NOTE — ED Notes (Signed)
 Called CCMD added pt to monitoring board.

## 2023-03-13 NOTE — Discharge Instructions (Signed)
 You may take pain medicines as needed. Apply moist heat to affected area several times daily. Return to the ER for worsening symptoms, persistent vomiting, difficulty breathing or other concerns.

## 2023-03-13 NOTE — ED Provider Notes (Signed)
 [facility] Provider Note    Event Date/Time   First MD Initiated Contact with Patient 03/13/23 0028     (approximate)   History   Chest Pain   HPI  Kathryn [name] is a 23 y.o. female who presents to the ED from home with her mother with a chief complaint of jaw pain, chest pain, palpitations and shortness of breath x 1 day.  Denies associated fever/chills, cough, abdominal pain, nausea, vomiting or dizziness.  Has an IUD.  Denies recent travel or trauma.     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   Vasovagal syncope 03/20/2017   Migraine without aura and without status migrainosus, not intractable 03/20/2017   Episodic tension-type headache, not intractable 03/20/2017   Transient alteration of awareness 03/20/2017   Seizure-like activity (HCC) 03/18/2017   Difficulty walking 03/18/2017   MDD (major depressive disorder) 01/10/2017   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 Authorizing Provider  HYDROcodone -acetaminophen  (NORCO) 5-325 MG tablet Take 1 tablet by mouth  every 6 (six) hours as needed for moderate pain (pain score 4-6). 03/13/23  Yes Robinette Vermell PARAS, MD  naproxen  (NAPROSYN ) 500 MG tablet Take 1 tablet (500 mg total) by mouth 2 (two) times daily with a meal. 03/13/23  Yes Diesha Rostad J, MD  acetaminophen  (TYLENOL ) 500 MG tablet Take 1,000 mg by mouth every 6 (six) hours as needed for headache.    [provider]  clindamycin (CLEOCIN T) 1 % external solution Apply topically 2 (two) times daily. 02/02/21   [provider]  cyclobenzaprine  (FLEXERIL ) 10 MG tablet Take 1 tablet (10 mg total) by mouth 2 (two) times daily as needed for muscle spasms. 09/06/22   Arloa Suzen RAMAN, NP  FLUoxetine  (PROZAC ) 20 MG capsule Take 20 mg by mouth daily.    [provider]  lisdexamfetamine (VYVANSE) 60 MG capsule Take 60 mg by mouth every morning.    [provider]  loratadine  (CLARITIN ) 10 MG tablet Take 10 mg by mouth daily. 03/04/16   [provider]  metoprolol  tartrate (LOPRESSOR ) 25 MG tablet Take 0.5 tablets (12.5 mg total) by mouth 2 (two) times daily. 07/25/21   Alvan Ronal BRAVO, MD  Multiple Vitamin (MULTIVITAMIN WITH MINERALS) TABS tablet Take 1 tablet by mouth daily.    [provider]  QUEtiapine (SEROQUEL) 50 MG tablet TAKE ONE TABLET AT BEDTIME. YOU MAY TAKE ANOTHER TABLET IN ONE HOUR IF NEEDED. 12/27/17   [provider]     Allergies  Flovent  hfa [fluticasone ],  Lactose intolerance (gi), Milk-related compounds, Pollen extract, and Sulfamethoxazole-trimethoprim   Family History  History reviewed. No pertinent family history.   Physical Exam  Triage Vital Signs: ED Triage Vitals  Encounter Vitals Group     BP 03/12/23 2124 (!) 143/90     Systolic BP Percentile --      Diastolic BP Percentile --      Pulse Rate 03/12/23 2124 (!) 106     Resp 03/12/23 2124 18     Temp 03/12/23 2124 98.1 F (36.7 C)     Temp Source 03/12/23 2124 Oral     SpO2 03/12/23 2124 98 %     Weight 03/12/23 2125  285 lb (129.3 kg)     Height 03/12/23 2125 5' 9 (1.753 m)     Head Circumference --      Peak Flow --      Pain Score 03/12/23 2125 6     Pain Loc --      Pain Education --      Exclude from Growth Chart --     Updated Vital Signs: BP 99/77   Pulse 89   Temp 98.3 F (36.8 C) (Oral)   Resp (!) 25   Ht 5' 9 (1.753 m)   Wt 129.3 kg   SpO2 100%   BMI 42.09 kg/m    General: Awake, mild distress.  Tearful. CV:  RRR.  Good peripheral perfusion.  Resp:  Normal effort.  CTAB.  Anterior chest tender to palpation. Abd:  Obese.  Nontender.  No distention.  No truncal vesicles. Other:  Bilateral TMJs tender to palpation.  Bilateral calves are supple and nontender.   ED Results / Procedures / Treatments  Labs (all labs ordered are listed, but only abnormal results are displayed) Labs Reviewed  BASIC METABOLIC PANEL - Abnormal; Notable for the following components:      Result Value   Glucose, Bld 103 (*)    All other components within normal limits  HEPATIC FUNCTION PANEL - Abnormal; Notable for the following components:   Total Protein 8.3 (*)    Alkaline Phosphatase 37 (*)    All other components within normal limits  TSH - Abnormal; Notable for the following components:   TSH 8.207 (*)    All other components within normal limits  POC URINE PREG, ED - Normal  RESP PANEL BY RT-PCR (RSV, FLU A&B, COVID)  RVPGX2  CBC  LIPASE, BLOOD  D-DIMER, QUANTITATIVE  MONONUCLEOSIS SCREEN  VALPROIC ACID  LEVEL  TROPONIN I (HIGH SENSITIVITY)  TROPONIN I (HIGH SENSITIVITY)     EKG  ED ECG REPORT I, Lyndsee Casa J, the attending physician, personally viewed and interpreted this ECG.   Date: 03/13/2023  EKG Time: 0139  Rate: 81  Rhythm: normal sinus rhythm  Axis: Normal   Intervals:none  ST&T Change: Nonspecific    RADIOLOGY I have independently visualized and interpreted patient's imaging study as well as noted the radiology interpretation:  Chest x-ray: No acute  cardiopulmonary process  Official radiology report(s): DG Chest 2 View Result Date: 03/12/2023 CLINICAL DATA:  Chest pain EXAM: CHEST - 2 VIEW COMPARISON:  Chest x-ray 08/10/2020 FINDINGS: The heart size and mediastinal contours are within normal limits. Both lungs are clear. The visualized skeletal structures are unremarkable. IMPRESSION: No active cardiopulmonary disease. Electronically Signed   By: Greig Pique M.D.   On: 03/12/2023 21:49     PROCEDURES:  Critical Care performed: No  .1-3 Lead EKG Interpretation  Performed by: Oliverio Cho  J, MD Authorized by: Robinette Vermell PARAS, MD     Interpretation: normal     ECG rate:  85   ECG rate assessment: normal     Rhythm: sinus rhythm     Ectopy: none     Conduction: normal   Comments:     Patient placed on cardiac monitor to evaluate for arrhythmias    MEDICATIONS ORDERED IN ED: Medications  sodium chloride  0.9 % bolus 1,000 mL (0 mLs Intravenous Stopped 03/13/23 0245)  ondansetron  (ZOFRAN ) injection 4 mg (4 mg Intravenous Given 03/13/23 0058)  famotidine  (PEPCID ) IVPB 20 mg premix (0 mg Intravenous Stopped 03/13/23 0211)  morphine  (PF) 4 MG/ML injection 4 mg (4 mg Intravenous Given 03/13/23 0058)  ketorolac  (TORADOL ) 30 MG/ML injection 15 mg (15 mg Intravenous Given 03/13/23 0229)     IMPRESSION / MDM / ASSESSMENT AND PLAN / ED COURSE  I reviewed the triage vital signs and the nursing notes.                             23 year old female presenting with chest pain, jaw pain, palpitations. Differential diagnosis includes, but is not limited to, ACS, aortic dissection, pulmonary embolism, cardiac tamponade, pneumothorax, pneumonia, pericarditis, myocarditis, GI-related causes including esophagitis/gastritis, and musculoskeletal chest wall pain.   I personally reviewed patient's records and note a GYN office visit on 11/19/2022 for IUD removal and reinsertion.  Patient's presentation is most consistent with acute complicated illness / injury  requiring diagnostic workup.  The patient is on the cardiac monitor to evaluate for evidence of arrhythmia and/or significant heart rate changes.  Laboratory results unremarkable, initial troponin, chest x-ray and EKG unremarkable.  Given patient's symptoms, will check LFT/lipase, D-dimer, TSH, monoscreen, valproic acid  level, respiratory panel.  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  Will administer IV fluids, Pepcid , morphine /Zofran  and reassess.  Clinical Course as of 03/13/23 0552  Thu Mar 13, 2023  0235 Updated patient and mother on all test results. Patient denies NSAID allergy; will administer Ketorolac  and discharge home with as needed Naprosyn  and Norco. Patient will follow up with her PCP as needed. Strict return precautions given. Both verbalize understanding and agree with plan of care. [JS]    Clinical Course User Index [JS] Robinette Vermell PARAS, MD     FINAL CLINICAL IMPRESSION(S) / ED DIAGNOSES   Final diagnoses:  Chest pain, unspecified type  Costochondritis     Rx / DC Orders   ED Discharge Orders          Ordered    naproxen  (NAPROSYN ) 500 MG tablet  2 times daily with meals        03/13/23 0237    HYDROcodone -acetaminophen  (NORCO) 5-325 MG tablet  Every 6 hours PRN        03/13/23 0237             Note:  This document was prepared using Dragon voice recognition software and may include unintentional dictation errors.   Avalon Coppinger J, MD 03/13/23 [phone]

## 2023-12-03 ENCOUNTER — Ambulatory Visit
Admission: RE | Admit: 2023-12-03 | Discharge: 2023-12-03 | Disposition: A | Discharge status: Home / Self Care | Payer: MEDICAID | Source: Ambulatory Visit

## 2023-12-03 VITALS — BP 104/72 | HR 100 | Temp 98.5°F | Resp 18

## 2023-12-03 DIAGNOSIS — K529 Noninfective gastroenteritis and colitis, unspecified: Secondary | ICD-10-CM | POA: Diagnosis not present

## 2023-12-03 MED ORDER — ONDANSETRON 4 MG PO TBDP: 4.0000 mg | ORAL_TABLET | Freq: Three times a day (TID) | ORAL | 0 refills | Status: AC | PRN

## 2023-12-03 MED ORDER — DIPHENOXYLATE-ATROPINE 2.5-0.025 MG PO TABS: 1.0000 | ORAL_TABLET | Freq: Four times a day (QID) | ORAL | 0 refills | Status: AC | PRN

## 2023-12-03 MED ORDER — AZITHROMYCIN 250 MG PO TABS: 250.0000 mg | ORAL_TABLET | Freq: Every day | ORAL | 0 refills | Status: AC

## 2023-12-03 NOTE — Discharge Instructions (Signed)
 Begin azithromycin as directed for treatment of bacteria  You can use zofran  every 8 hours as needed for nausea, be mindful this medication may make you drowsy, take the first dose at home to see how it affects your body  You can use Lomotil every 6 hours to help with diarrhea, and be mindful over use of this medication may cause opposite effect constipation  You can use over-the-counter ibuprofen or Tylenol , which ever you have at home, to help manage fevers  Continue to promote hydration throughout the day by using electrolyte replacement solution such as Gatorade, body armor, Pedialyte, which ever you have at home  Try eating bland foods such as bread, rice, toast, fruit which are easier on the stomach to digest, avoid foods that are overly spicy, overly seasoned or greasy

## 2023-12-03 NOTE — ED Provider Notes (Signed)
 Kathryn [name]    CSN: [id number] Arrival date & time: 12/03/23  1633      History   Chief Complaint Chief Complaint  Patient presents with   Fever    I've had a fever and diarrhea and cramping for days wondering if I'm too dehydrated also need a note for school and work. - Entered by patient   Diarrhea   Chills   Generalized Body Aches    HPI Kathryn [name] is a 23 y.o. female.   Patient presents for evaluation of fever peaking at 100.5, chills, body aches, nausea without vomiting, watery diarrhea and abdominal pain present for 5 days.  Abdominal pain is generalized described as a period cramp.  Also experienced mild headaches, nasal congestion and sinus pressure.  Decreased food and fluid intake.  Has attempted use of nauseating, Imodium and ibuprofen.  No known sick contacts prior, denies recent travel.    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   Vasovagal syncope 03/20/2017   Migraine without aura and without status migrainosus, not intractable 03/20/2017   Episodic tension-type headache, not intractable 03/20/2017   Transient alteration of awareness 03/20/2017   Seizure-like activity (HCC) 03/18/2017   Difficulty walking 03/18/2017   MDD (major depressive disorder) 01/10/2017   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      Home Medications    Prior to Admission medications   Medication Sig Start Date End Date Taking? Authorizing Provider  amphetamine-dextroamphetamine (ADDERALL XR) 20 MG 24 hr capsule Take 20 mg by mouth every morning. 08/20/23  Yes [provider]  azithromycin (ZITHROMAX) 250 MG tablet Take 1 tablet (250 mg total) by mouth daily. Take first 2 tablets together, then 1 every day until finished. 12/03/23  Yes Tarance Balan R, NP  diphenoxylate-atropine (LOMOTIL) 2.5-0.025 MG tablet Take 1 tablet by mouth 4 (four) times daily as needed for diarrhea or loose stools. 12/03/23  Yes Tyrese Ficek R, NP  divalproex (DEPAKOTE) 500 MG DR tablet Take 1,000 mg by mouth at bedtime. 08/20/23  Yes [provider]  eszopiclone (LUNESTA) 2 MG TABS tablet Take 2 mg by mouth at bedtime as needed for sleep. 07/05/22 12/04/23 Yes [provider]  fluvoxaMINE (LUVOX) 50 MG tablet Take 50 mg by mouth daily. 08/20/23  Yes [provider]  melatonin 3 MG TABS tablet Take 3 mg by mouth at bedtime. 03/27/15  Yes [provider]  mirtazapine (REMERON) 15 MG tablet Take 15 mg by mouth at bedtime. 07/24/22  Yes [provider]  ondansetron  (ZOFRAN -ODT) 4 MG disintegrating tablet Take 1 tablet (4 mg total) by mouth every 8 (eight) hours as needed. 12/03/23  Yes Teresa Shelba SAUNDERS, NP  QUEtiapine (SEROQUEL) 100 MG tablet Take 100 mg by mouth 3 (three) times daily. 08/30/22  Yes [provider]  acetaminophen  (TYLENOL ) 500 MG tablet Take 1,000 mg by mouth every 6 (six) hours as needed for headache.    [provider]  clindamycin (CLEOCIN T) 1 % external solution Apply topically 2 (two) times daily. 02/02/21   [provider]  cyclobenzaprine  (FLEXERIL ) 10 MG tablet Take 1 tablet (10 mg total) by mouth 2 (two) times daily as needed  for muscle spasms. 09/06/22   Arloa Suzen RAMAN, NP  FLUoxetine  (PROZAC ) 20 MG capsule Take 20 mg by mouth daily.    [provider]  HYDROcodone -acetaminophen  (NORCO) 5-325 MG tablet Take 1 tablet by mouth every 6 (six) hours as needed for moderate pain (pain score 4-6). 03/13/23   Sung, Jade J, MD  lisdexamfetamine (VYVANSE) 60 MG capsule Take 60 mg by mouth every morning.    [provider]  loratadine  (CLARITIN ) 10 MG tablet Take 10 mg by mouth daily. 03/04/16   [provider]  metoprolol  tartrate (LOPRESSOR ) 25 MG tablet Take 0.5 tablets (12.5 mg total) by mouth 2 (two) times daily. 07/25/21   Alvan Ronal BRAVO, MD  Multiple Vitamin (MULTIVITAMIN WITH MINERALS) TABS tablet Take 1 tablet by mouth daily.    [provider]  naproxen  (NAPROSYN ) 500 MG tablet Take 1 tablet (500 mg total) by mouth 2 (two) times daily with a meal. 03/13/23   Sung, Jade J, MD  QUEtiapine (SEROQUEL) 50 MG tablet TAKE ONE TABLET AT BEDTIME. YOU MAY TAKE ANOTHER TABLET IN ONE HOUR IF NEEDED. 12/27/17   [provider]    Family History History reviewed. No pertinent family history.  Social History Social History   Tobacco Use   Smoking status: Every Day    Types: E-cigarettes   Smokeless tobacco: Never   Tobacco comments:    vapes  Vaping Use   Vaping status: Never Used  Substance Use Topics   Alcohol use: No   Drug use: No     Allergies   Flovent  hfa [fluticasone ], Lactose intolerance (gi), Milk-related compounds, Pollen extract, and Sulfamethoxazole-trimethoprim   Review of Systems Review of Systems   Physical Exam Triage Vital Signs ED Triage Vitals  Encounter Vitals Group     BP 12/03/23 1644 104/72     Girls Systolic BP Percentile --      Girls Diastolic BP Percentile --      Boys Systolic BP Percentile --      Boys Diastolic BP Percentile --      Pulse Rate 12/03/23 1644 100     Resp 12/03/23 1644 18     Temp 12/03/23 1644 98.5 F (36.9 C)      Temp Source 12/03/23 1644 Oral     SpO2 12/03/23 1644 96 %     Weight --      Height --      Head Circumference --      Peak Flow --      Pain Score 12/03/23 1642 4     Pain Loc --      Pain Education --      Exclude from Growth Chart --    No data found.  Updated Vital Signs BP 104/72 (BP Location: Left Arm)   Pulse 100   Temp 98.5 F (36.9 C) (Oral)   Resp 18   SpO2 96%   Visual Acuity Right Eye Distance:   Left Eye Distance:   Bilateral  Distance:    Right Eye Near:   Left Eye Near:    Bilateral Near:     Physical Exam Constitutional:      Appearance: Normal appearance.  Eyes:     Extraocular Movements: Extraocular movements intact.  Pulmonary:     Effort: Pulmonary effort is normal.  Abdominal:     General: Abdomen is flat. Bowel sounds are increased.     Palpations: Abdomen is soft.     Tenderness: There is abdominal tenderness in the right lower quadrant and left lower quadrant. There is no guarding.  Neurological:     Mental Status: She is alert and oriented to person, place, and time.      UC Treatments / Results  Labs (all labs ordered are listed, but only abnormal results are displayed) Labs Reviewed - No data to display  EKG   Radiology No results found.  Procedures Procedures (including critical care time)  Medications Ordered in UC Medications - No data to display  Initial Impression / Assessment and Plan / UC Course  I have reviewed the triage vital signs and the nursing notes.  Pertinent labs & imaging results that were available during my care of the patient were reviewed by me and considered in my medical decision making (see chart for details).  Gastroenteritis  Symptoms predominantly affecting the GI tract, viral testing deferred due to timeline, symptoms present for 5 days without resolution and prescribed azithromycin, Lomotil and Zofran , recommend increase fluids with food as tolerated, advised over-the-counter medications  and nonpharmacological measures for remaining symptoms, advised to follow-up if symptoms persist, work note given Final Clinical Impressions(s) / UC Diagnoses   Final diagnoses:  Gastroenteritis     Discharge Instructions      Begin azithromycin as directed for treatment of bacteria  You can use zofran  every 8 hours as needed for nausea, be mindful this medication may make you drowsy, take the first dose at home to see how it affects your body  You can use Lomotil every 6 hours to help with diarrhea, and be mindful over use of this medication may cause opposite effect constipation  You can use over-the-counter ibuprofen or Tylenol , which ever you have at home, to help manage fevers  Continue to promote hydration throughout the day by using electrolyte replacement solution such as Gatorade, body armor, Pedialyte, which ever you have at home  Try eating bland foods such as bread, rice, toast, fruit which are easier on the stomach to digest, avoid foods that are overly spicy, overly seasoned or greasy    ED Prescriptions     Medication Sig Dispense Auth. Provider   azithromycin (ZITHROMAX) 250 MG tablet Take 1 tablet (250 mg total) by mouth daily. Take first 2 tablets together, then 1 every day until finished. 6 tablet Anahlia Iseminger R, NP   ondansetron  (ZOFRAN -ODT) 4 MG disintegrating tablet Take 1 tablet (4 mg total) by mouth every 8 (eight) hours as needed. 20 tablet Quanell Loughney R, NP   diphenoxylate-atropine (LOMOTIL) 2.5-0.025 MG tablet Take 1 tablet by mouth 4 (four) times daily as needed for diarrhea or loose stools. 20 tablet Fischer Halley R, NP      I have reviewed the PDMP during this encounter.   Teresa Shelba SAUNDERS, NP 12/03/23 [phone]

## 2023-12-03 NOTE — ED Triage Notes (Signed)
 Patient reports nausea, fever, diarrhea, chills and bodyaches x 5 days. Patient has taken imodium, Ibuprofen and Nauzene with mild relief. Patient states she needs work for past days missed and to continue to stay out. Rates bodyaches 4/10.

## 2024-03-03 ENCOUNTER — Ambulatory Visit
Admission: EM | Admit: 2024-03-03 | Discharge: 2024-03-03 | Disposition: A | Discharge status: Home / Self Care | Payer: MEDICAID | Attending: Emergency Medicine | Admitting: Emergency Medicine

## 2024-03-03 DIAGNOSIS — J069 Acute upper respiratory infection, unspecified: Secondary | ICD-10-CM

## 2024-03-03 DIAGNOSIS — H6691 Otitis media, unspecified, right ear: Secondary | ICD-10-CM | POA: Diagnosis not present

## 2024-03-03 LAB — POC COVID19/FLU A&B COMBO
Covid Antigen, POC: NEGATIVE
Influenza A Antigen, POC: NEGATIVE
Influenza B Antigen, POC: NEGATIVE

## 2024-03-03 LAB — POCT RAPID STREP A (OFFICE): Rapid Strep A Screen: NEGATIVE

## 2024-03-03 MED ORDER — AMOXICILLIN 875 MG PO TABS: 875.0000 mg | ORAL_TABLET | Freq: Two times a day (BID) | ORAL | 0 refills | Status: AC

## 2024-03-03 NOTE — Discharge Instructions (Addendum)
 Your strep, COVID, and flu tests are negative.    Take the amoxicillin  as directed for your ear infection.    Follow up with your primary care provider.

## 2024-03-03 NOTE — ED Triage Notes (Signed)
 Right ear pain, congestion, headache, sore throat that started today. Taking otc allergy medication.

## 2024-03-03 NOTE — ED Provider Notes (Signed)
 " CAY RALPH PELT    CSN: [id number] Arrival date & time: 03/03/24  1538      History   Chief Complaint Chief Complaint  Patient presents with   Otalgia   Nasal Congestion    HPI Kathryn [name] is a 24 y.o. female.  Patient presents with ear pain, sore throat, congestion, cough, headache since this morning.  No fever, shortness of breath, vomiting, diarrhea.  She took a decongestant this morning.  The history is provided by the patient and medical records.    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   Vasovagal syncope 03/20/2017   Migraine without aura and without status migrainosus, not intractable 03/20/2017   Episodic tension-type headache, not intractable 03/20/2017   Transient alteration of awareness 03/20/2017   Seizure-like activity (HCC) 03/18/2017   Difficulty walking 03/18/2017   MDD (major depressive disorder) 01/10/2017   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      Home Medications    Prior to Admission medications  Medication Sig Start Date End Date Taking? Authorizing Provider  amoxicillin  (AMOXIL ) 875 MG tablet Take 1 tablet (875 mg total) by mouth 2 (two) times daily for 10 days. 03/03/24 03/13/24  Yes Corlis Burnard DEL, NP  amphetamine-dextroamphetamine (ADDERALL XR) 20 MG 24 hr capsule Take 20 mg by mouth every morning. 08/20/23  Yes [provider]  divalproex (DEPAKOTE) 500 MG DR tablet Take 1,000 mg by mouth at bedtime. 08/20/23  Yes [provider]  eszopiclone (LUNESTA) 2 MG TABS tablet Take 2 mg by mouth at bedtime as needed for sleep. 07/05/22 03/03/24 Yes [provider]  fluvoxaMINE (LUVOX) 50 MG tablet Take 50 mg by mouth daily. 08/20/23  Yes [provider]  QUEtiapine (SEROQUEL) 100 MG tablet Take 100 mg by mouth 3 (three) times daily. 08/30/22  Yes [provider]  acetaminophen  (TYLENOL ) 500 MG tablet Take 1,000 mg by mouth every 6 (six) hours as needed for headache.    [provider]  azithromycin  (ZITHROMAX ) 250 MG tablet Take 1 tablet (250 mg total) by mouth daily. Take first 2 tablets together, then 1 every day until finished. 12/03/23   White, Shelba SAUNDERS, NP  clindamycin (CLEOCIN T) 1 % external solution Apply topically 2 (two) times daily. 02/02/21   [provider]  cyclobenzaprine  (FLEXERIL ) 10 MG tablet Take 1 tablet (10 mg total) by mouth 2 (two) times daily as needed for muscle spasms. 09/06/22   Arloa Suzen RAMAN, NP  diphenoxylate -atropine  (LOMOTIL ) 2.5-0.025 MG tablet Take 1 tablet by mouth 4 (four) times daily as needed for diarrhea or loose stools. 12/03/23   White, Shelba SAUNDERS, NP  FLUoxetine  (PROZAC ) 20 MG capsule Take 20 mg  by mouth daily.    [provider]  HYDROcodone -acetaminophen  (NORCO) 5-325 MG tablet Take 1 tablet by mouth every 6 (six) hours as needed for moderate pain (pain score 4-6). 03/13/23   Sung, Jade J, MD  lisdexamfetamine (VYVANSE) 60 MG capsule Take 60 mg by mouth every morning.    [provider]  loratadine  (CLARITIN ) 10 MG tablet Take 10 mg by mouth daily. 03/04/16   [provider]  melatonin 3 MG TABS tablet Take 3 mg by mouth at bedtime. 03/27/15   [provider]  metoprolol  tartrate (LOPRESSOR ) 25 MG tablet Take 0.5 tablets (12.5 mg total) by mouth 2 (two) times daily. 07/25/21   Alvan Ronal BRAVO, MD  mirtazapine (REMERON) 15 MG tablet Take 15 mg by mouth at bedtime. 07/24/22   [provider]  Multiple Vitamin (MULTIVITAMIN WITH MINERALS) TABS tablet Take 1 tablet by mouth daily.    [provider]  naproxen  (NAPROSYN ) 500 MG tablet Take 1 tablet (500 mg total) by mouth 2 (two) times daily with a meal. 03/13/23   Sung, Jade J, MD  ondansetron  (ZOFRAN -ODT) 4 MG disintegrating tablet Take 1 tablet (4 mg total) by mouth every 8 (eight) hours as needed. 12/03/23   White, Adrienne R, NP  QUEtiapine (SEROQUEL) 50 MG tablet TAKE ONE TABLET AT BEDTIME. YOU MAY TAKE ANOTHER TABLET IN ONE HOUR IF NEEDED. 12/27/17   [provider]    Family History History reviewed. No pertinent family history.  Social History Social History[1]   Allergies   Flovent  hfa [fluticasone ], Lactose intolerance (gi), Milk-related compounds, Pollen extract, and Sulfamethoxazole-trimethoprim   Review of Systems Review of Systems  Constitutional:  Negative for chills and fever.  HENT:  Positive for congestion, ear pain and sore throat.   Respiratory:  Positive for cough. Negative for shortness of breath.   Gastrointestinal:  Negative for diarrhea and vomiting.     Physical Exam Triage Vital Signs ED Triage Vitals  Encounter Vitals Group     BP 03/03/24 1720 119/83     Girls Systolic BP Percentile --      Girls Diastolic BP Percentile --      Boys Systolic BP Percentile --      Boys Diastolic BP Percentile --      Pulse Rate 03/03/24 1720 (!) 104     Resp 03/03/24 1720 18     Temp 03/03/24 1720 98.3 F (36.8 C)     Temp src --      SpO2 03/03/24 1720 98 %     Weight --      Height --      Head Circumference --      Peak Flow --      Pain Score 03/03/24 1730 4     Pain Loc --      Pain Education --      Exclude from Growth  Chart --    No data found.  Updated Vital Signs BP 119/83   Pulse (!) 104   Temp 98.3 F (36.8 C)   Resp 18   LMP  (LMP Unknown)   SpO2 98%   Visual Acuity Right Eye Distance:   Left Eye Distance:   Bilateral Distance:    Right Eye Near:   Left Eye Near:    Bilateral Near:     Physical Exam Constitutional:      General: She is not in acute distress. HENT:     Right Ear: Tympanic membrane is erythematous.  Left Ear: Tympanic membrane normal.     Nose: Nose normal.     Mouth/Throat:     Mouth: Mucous membranes are moist.     Pharynx: Oropharynx is clear.  Cardiovascular:     Rate and Rhythm: Normal rate and regular rhythm.     Heart sounds: Normal heart sounds.  Pulmonary:     Effort: Pulmonary effort is normal. No respiratory distress.     Breath sounds: Normal breath sounds.  Neurological:     Mental Status: She is alert.      UC Treatments / Results  Labs (all labs ordered are listed, but only abnormal results are displayed) Labs Reviewed  POC COVID19/FLU A&B COMBO  POCT RAPID STREP A (OFFICE)    EKG   Radiology No results found.  Procedures Procedures (including critical care time)  Medications Ordered in UC Medications - No data to display  Initial Impression / Assessment and Plan / UC Course  I have reviewed the triage vital signs and the nursing notes.  Pertinent labs & imaging results that were available during my care of the patient were reviewed by me and considered in my medical decision making (see chart for details).   Right otitis media, acute URI.  Afebrile and vital signs are stable.  Lungs are clear and O2 sat is 98% on room air.  Right TM is brightly erythematous.  Treating with amoxicillin .  Education provided on otitis media and URI.  Instructed her to follow-up with her PCP.  She agrees to plan of care. Final Clinical Impressions(s) / UC Diagnoses   Final diagnoses:  Right otitis media, unspecified otitis media type   Acute upper respiratory infection     Discharge Instructions      Your strep, COVID, and flu tests are negative.    Take the amoxicillin  as directed for your ear infection.    Follow up with your primary care provider.         ED Prescriptions     Medication Sig Dispense Auth. Provider   amoxicillin  (AMOXIL ) 875 MG tablet Take 1 tablet (875 mg total) by mouth 2 (two) times daily for 10 days. 20 tablet Corlis Burnard DEL, NP      PDMP not reviewed this encounter.    [1]  Social History Tobacco Use   Smoking status: Every Day    Types: E-cigarettes   Smokeless tobacco: Never   Tobacco comments:    vapes  Vaping Use   Vaping status: Never Used  Substance Use Topics   Alcohol use: No   Drug use: No     Corlis Burnard DEL, NP 03/03/24 1755  "
